# Patient Record
Sex: Male | Born: 1970 | Hispanic: Yes | Marital: Married | Smoking: Never smoker
Health system: Southern US, Community
[De-identification: ages and names within clinical notes are randomized; demographics above are authoritative.]

## PROBLEM LIST (undated history)

## (undated) DIAGNOSIS — N200 Calculus of kidney: Secondary | ICD-10-CM

## (undated) DIAGNOSIS — K8020 Calculus of gallbladder without cholecystitis without obstruction: Secondary | ICD-10-CM

## (undated) HISTORY — PX: CHOLECYSTECTOMY: SHX55

## (undated) HISTORY — PX: PERCUTANEOUS PINNING PHALANX FRACTURE OF HAND: SUR1027

## (undated) HISTORY — PX: KIDNEY STONE SURGERY: SHX686

---

## 2017-09-05 ENCOUNTER — Emergency Department
Admission: EM | Admit: 2017-09-05 | Discharge: 2017-09-05 | Disposition: A | Discharge status: Home / Self Care | Payer: Self-pay | Source: Home / Self Care | Attending: Family Medicine | Admitting: Family Medicine

## 2017-09-05 ENCOUNTER — Encounter: Payer: Self-pay | Admitting: *Deleted

## 2017-09-05 ENCOUNTER — Other Ambulatory Visit: Payer: Self-pay

## 2017-09-05 DIAGNOSIS — J209 Acute bronchitis, unspecified: Secondary | ICD-10-CM

## 2017-09-05 MED ORDER — BENZONATATE 200 MG PO CAPS: ORAL_CAPSULE | ORAL | 0 refills | Status: DC

## 2017-09-05 MED ORDER — AZITHROMYCIN 250 MG PO TABS: ORAL_TABLET | ORAL | 0 refills | Status: DC

## 2017-09-05 NOTE — ED Triage Notes (Signed)
Pt c/o productive cough and HA x 1 wk. Denies fever.

## 2017-09-05 NOTE — Discharge Instructions (Signed)
Take plain guaifenesin (1200mg extended release tabs such as Mucinex) twice daily, with plenty of water, for cough and congestion.  May add Pseudoephedrine (30mg, one or two every 4 to 6 hours) for sinus congestion.  Get adequate rest.   °May use Afrin nasal spray (or generic oxymetazoline) each morning for about 5 days and then discontinue.  Also recommend using saline nasal spray several times daily and saline nasal irrigation (AYR is a common brand).  °Try warm salt water gargles for sore throat.  °Stop all antihistamines for now, and other non-prescription cough/cold preparations. °  °  °

## 2017-09-05 NOTE — ED Provider Notes (Signed)
[name]    CSN: [id number] Arrival date & time: 09/05/17  1705     History   Chief Complaint Chief Complaint  Patient presents with  . Cough    HPI Willie Wise is a 47 y.o. male.   Patient complains of a persistent cough for one week with headache.  Mild sinus congestion but no sore throat.  No pleuritic pain or shortness of breath.   The history is provided by the patient.    History reviewed. No pertinent past medical history.  There are no active problems to display for this patient.   History reviewed. No pertinent surgical history.     Home Medications    Prior to Admission medications   Medication Sig Start Date End Date Taking? Authorizing Provider  azithromycin (ZITHROMAX Z-PAK) 250 MG tablet Take 2 tabs today; then begin one tab once daily for 4 more days. 09/05/17   Lattie Haw, MD  benzonatate (TESSALON) 200 MG capsule Take one cap by mouth at bedtime as needed for cough.  May repeat in 4 to 6 hours 09/05/17   Lattie Haw, MD    Family History History reviewed. No pertinent family history.  Social History Social History   Tobacco Use  . Smoking status: Never Smoker  . Smokeless tobacco: Never Used  Substance Use Topics  . Alcohol use: No    Frequency: Never  . Drug use: No     Allergies   Patient has no known allergies.   Review of Systems Review of Systems + sore throat + cough No pleuritic pain No wheezing + nasal congestion + post-nasal drainage No sinus pain/pressure No itchy/red eyes No earache No hemoptysis No SOB No fever, + chills No nausea No vomiting No abdominal pain No diarrhea No urinary symptoms No skin rash + fatigue No myalgias + headache Used OTC meds without relief   Physical Exam Triage Vital Signs ED Triage Vitals  Enc Vitals Group     BP 09/05/17 1737 122/81     Pulse Rate 09/05/17 1737 63     Resp 09/05/17 1737 16     Temp 09/05/17 1737 98 F (36.7 C)     Temp  Source 09/05/17 1737 Oral     SpO2 09/05/17 1737 97 %     Weight 09/05/17 1743 224 lb (101.6 kg)     Height 09/05/17 1743 6' (1.829 m)     Head Circumference --      Peak Flow --      Pain Score 09/05/17 1738 0     Pain Loc --      Pain Edu? --      Excl. in GC? --    No data found.  Updated Vital Signs BP 122/81 (BP Location: Right Arm)   Pulse 63   Temp 98 F (36.7 C) (Oral)   Resp 16   Ht 6' (1.829 m)   Wt 224 lb (101.6 kg)   SpO2 97%   BMI 30.38 kg/m   Visual Acuity Right Eye Distance:   Left Eye Distance:   Bilateral Distance:    Right Eye Near:   Left Eye Near:    Bilateral Near:     Physical Exam Nursing notes and Vital Signs reviewed. Appearance:  Patient appears stated age, and in no acute distress Eyes:  Pupils are equal, round, and reactive to light and accomodation.  Extraocular movement is intact.  Conjunctivae are not inflamed  Ears:  Canals normal.  Tympanic membranes normal.  Nose:  Mildly congested turbinates.  No sinus tenderness. Pharynx:  Normal Neck:  Supple.  Enlarged posterior/lateral nodes are palpated bilaterally, tender to palpation on the left.   Lungs:  Clear to auscultation.  Breath sounds are equal.  Moving air well. Heart:  Regular rate and rhythm without murmurs, rubs, or gallops.  Abdomen:  Nontender without masses or hepatosplenomegaly.  Bowel sounds are present.  No CVA or flank tenderness.  Extremities:  No edema.  Skin:  No rash present.    UC Treatments / Results  Labs (all labs ordered are listed, but only abnormal results are displayed) Labs Reviewed - No data to display  EKG  EKG Interpretation None       Radiology No results found.  Procedures Procedures (including critical care time)  Medications Ordered in UC Medications - No data to display   Initial Impression / Assessment and Plan / UC Course  I have reviewed the triage vital signs and the nursing notes.  Pertinent labs & imaging results that were  available during my care of the patient were reviewed by me and considered in my medical decision making (see chart for details).    Begin Z-pak for atypical coverage. Prescription written for Benzonatate [facility](Tessalon) to take at bedtime for night-time cough.  Take plain guaifenesin (1200mg  extended release tabs such as Mucinex) twice daily, with plenty of water, for cough and congestion.  May add Pseudoephedrine (30mg , one or two every 4 to 6 hours) for sinus congestion.  Get adequate rest.   May use Afrin nasal spray (or generic oxymetazoline) each morning for about 5 days and then discontinue.  Also recommend using saline nasal spray several times daily and saline nasal irrigation (AYR is a common brand).   Try warm salt water gargles for sore throat.  Stop all antihistamines for now, and other non-prescription cough/cold preparations. Followup with Family Doctor if not improved in one week.     Final Clinical Impressions(s) / UC Diagnoses   Final diagnoses:  Acute bronchitis, unspecified organism    ED Discharge Orders        Ordered    azithromycin (ZITHROMAX Z-PAK) 250 MG tablet     09/05/17 1817    benzonatate (TESSALON) 200 MG capsule     09/05/17 1817           Lattie HawBeese, Ameliya Nicotra A, MD 09/17/17 1520

## 2018-08-01 ENCOUNTER — Emergency Department
Admission: EM | Admit: 2018-08-01 | Discharge: 2018-08-01 | Disposition: A | Discharge status: Home / Self Care | Payer: Self-pay | Source: Home / Self Care | Attending: Family Medicine | Admitting: Family Medicine

## 2018-08-01 ENCOUNTER — Other Ambulatory Visit: Payer: Self-pay

## 2018-08-01 ENCOUNTER — Emergency Department (INDEPENDENT_AMBULATORY_CARE_PROVIDER_SITE_OTHER): Payer: Self-pay

## 2018-08-01 ENCOUNTER — Encounter: Payer: Self-pay | Admitting: *Deleted

## 2018-08-01 DIAGNOSIS — R109 Unspecified abdominal pain: Secondary | ICD-10-CM

## 2018-08-01 HISTORY — DX: Calculus of kidney: N20.0

## 2018-08-01 LAB — POCT URINALYSIS DIP (MANUAL ENTRY)
Bilirubin, UA: NEGATIVE
Glucose, UA: NEGATIVE mg/dL
Ketones, POC UA: NEGATIVE mg/dL
Leukocytes, UA: NEGATIVE
Nitrite, UA: NEGATIVE
Protein Ur, POC: NEGATIVE mg/dL
Spec Grav, UA: 1.02 (ref 1.010–1.025)
Urobilinogen, UA: 0.2 E.U./dL
pH, UA: 6.5 (ref 5.0–8.0)

## 2018-08-01 MED ORDER — NAPROXEN 500 MG PO TABS: 500.0000 mg | ORAL_TABLET | Freq: Two times a day (BID) | ORAL | 0 refills | Status: DC

## 2018-08-01 MED ORDER — TAMSULOSIN HCL 0.4 MG PO CAPS: 0.4000 mg | ORAL_CAPSULE | Freq: Every day | ORAL | 0 refills | Status: DC

## 2018-08-01 NOTE — ED Triage Notes (Signed)
Pt c/o RT flank pain intermittently x 2 months. Hx of kidney stones.

## 2018-08-01 NOTE — Discharge Instructions (Signed)
°  Your symptoms are likely due to kidney stones. Be sure to stay well hydrated. You may take the prescribed medication to help with pain. Please follow up with family medicine or a urologist next week if not improving.  Call 911 or go to the hospital if symptoms worsening.

## 2018-08-01 NOTE — ED Provider Notes (Signed)
[facility]    CSN: [id number] Arrival date & time: 08/01/18  1810     History   Chief Complaint Chief Complaint  Patient presents with  . Flank Pain    HPI Willie Wise is a 48 y.o. male.   HPI Willie Wise is a 48 y.o. male presenting to UC with c/o intermittent Right flank pain for about 2 months. Hx of kidney stones. Denies fever, chills, n/v/d. Denies dysuria or hematuria. He has not tried any medication for the pain. Pt does work for [employer] but denies known injury.    Past Medical History:  Diagnosis Date  . Kidney stone     There are no active problems to display for this patient.   Past Surgical History:  Procedure Laterality Date  . KIDNEY STONE SURGERY         Home Medications    Prior to Admission medications   Medication Sig Start Date End Date Taking? Authorizing Provider  naproxen (NAPROSYN) 500 MG tablet Take 1 tablet (500 mg total) by mouth 2 (two) times daily. 08/01/18   Lurene Shadow, PA-C  tamsulosin (FLOMAX) 0.4 MG CAPS capsule Take 1 capsule (0.4 mg total) by mouth daily after breakfast. 08/01/18   Lurene Shadow, PA-C    Family History History reviewed. No pertinent family history.  Social History Social History   Tobacco Use  . Smoking status: Never Smoker  . Smokeless tobacco: Never Used  Substance Use Topics  . Alcohol use: Yes    Frequency: Never    Comment: occ  . Drug use: No     Allergies   Patient has no known allergies.   Review of Systems Review of Systems  Constitutional: Negative for chills and fever.  Gastrointestinal: Positive for abdominal pain (Right side). Negative for diarrhea, nausea and vomiting.  Genitourinary: Positive for flank pain (Right). Negative for dysuria, frequency, hematuria and penile pain.  Musculoskeletal: Positive for back pain. Negative for myalgias.     Physical Exam Triage Vital Signs ED Triage Vitals  Enc Vitals Group     BP 08/01/18 1925 130/81     Pulse  Rate 08/01/18 1925 62     Resp 08/01/18 1925 18     Temp 08/01/18 1925 98 F (36.7 C)     Temp Source 08/01/18 1925 Oral     SpO2 08/01/18 1925 98 %     Weight 08/01/18 1927 228 lb (103.4 kg)     Height 08/01/18 1927 6' (1.829 m)     Head Circumference --      Peak Flow --      Pain Score 08/01/18 1926 1     Pain Loc --      Pain Edu? --      Excl. in GC? --    No data found.  Updated Vital Signs BP 130/81 (BP Location: Right Arm)   Pulse 62   Temp 98 F (36.7 C) (Oral)   Resp 18   Ht 6' (1.829 m)   Wt 228 lb (103.4 kg)   SpO2 98%   BMI 30.92 kg/m   Visual Acuity Right Eye Distance:   Left Eye Distance:   Bilateral Distance:    Right Eye Near:   Left Eye Near:    Bilateral Near:     Physical Exam Vitals signs and nursing note reviewed.  Constitutional:      Appearance: Normal appearance. He is well-developed.  HENT:     Head: Normocephalic and atraumatic.  Neck:     Musculoskeletal: Normal range of motion.  Cardiovascular:     Rate and Rhythm: Normal rate and regular rhythm.  Pulmonary:     Effort: Pulmonary effort is normal.     Breath sounds: Normal breath sounds.  Abdominal:     General: There is no distension.     Palpations: Abdomen is soft.     Tenderness: There is no abdominal tenderness. There is no right CVA tenderness or left CVA tenderness.  Musculoskeletal: Normal range of motion.  Skin:    General: Skin is warm and dry.  Neurological:     Mental Status: He is alert and oriented to person, place, and time.  Psychiatric:        Behavior: Behavior normal.      UC Treatments / Results  Labs (all labs ordered are listed, but only abnormal results are displayed) Labs Reviewed  POCT URINALYSIS DIP (MANUAL ENTRY) - Abnormal; Notable for the following components:      Result Value   Blood, UA trace-lysed (*)    All other components within normal limits    EKG None  Radiology No results found.  Procedures Procedures (including  critical care time)  Medications Ordered in UC Medications - No data to display  Initial Impression / Assessment and Plan / UC Course  I have reviewed the triage vital signs and the nursing notes.  Pertinent labs & imaging results that were available during my care of the patient were reviewed by me and considered in my medical decision making (see chart for details).     Normal exam Trace hematuria Possible small renal stone Encouraged symptomatic tx F/u with PCP or urology as needed.  Final Clinical Impressions(s) / UC Diagnoses   Final diagnoses:  Right flank pain     Discharge Instructions      Your symptoms are likely due to kidney stones. Be sure to stay well hydrated. You may take the prescribed medication to help with pain. Please follow up with family medicine or a urologist next week if not improving.  Call 911 or go to the hospital if symptoms worsening.     ED Prescriptions    Medication Sig Dispense Auth. Provider   tamsulosin (FLOMAX) 0.4 MG CAPS capsule Take 1 capsule (0.4 mg total) by mouth daily after breakfast. 20 capsule Doroteo Glassman, Arsal Tappan O, PA-C   naproxen (NAPROSYN) 500 MG tablet Take 1 tablet (500 mg total) by mouth 2 (two) times daily. 30 tablet Lurene Shadow, PA-C     Controlled Substance Prescriptions [place] Controlled Substance Registry consulted? Not Applicable   Rolla Plate 08/04/18 9449

## 2019-06-27 ENCOUNTER — Ambulatory Visit: Payer: HRSA Program | Attending: Psychology

## 2019-06-27 ENCOUNTER — Other Ambulatory Visit: Payer: Self-pay

## 2019-06-27 DIAGNOSIS — Z20828 Contact with and (suspected) exposure to other viral communicable diseases: Secondary | ICD-10-CM | POA: Insufficient documentation

## 2019-06-27 DIAGNOSIS — Z20822 Contact with and (suspected) exposure to covid-19: Secondary | ICD-10-CM

## 2019-06-28 LAB — NOVEL CORONAVIRUS, NAA: SARS-CoV-2, NAA: NOT DETECTED

## 2019-06-28 NOTE — Progress Notes (Signed)
Orders moved to this encounter.  

## 2019-06-28 NOTE — Progress Notes (Signed)
Order(s) created erroneously. Erroneous order ID: [id number]  Order moved by: Brigitte Pulse  Order move date/time: 06/28/2019 2:47 PM  Source Patient: [name]  Source Contact: 06/27/2019  Destination Patient: [name]  Destination Contact: 06/27/2019

## 2019-06-28 NOTE — Progress Notes (Signed)
Order(s) created erroneously. Erroneous order ID: [id number]  Order moved by: Caisen Mangas M  Order move date/time: 06/28/2019 2:47 PM  Source Patient: [id number]  Source Contact: 06/27/2019  Destination Patient: [id number]  Destination Contact: 06/27/2019 

## 2019-07-02 ENCOUNTER — Other Ambulatory Visit: Payer: Self-pay

## 2019-08-05 ENCOUNTER — Emergency Department (INDEPENDENT_AMBULATORY_CARE_PROVIDER_SITE_OTHER): Payer: Self-pay

## 2019-08-05 ENCOUNTER — Encounter: Payer: Self-pay | Admitting: Emergency Medicine

## 2019-08-05 ENCOUNTER — Emergency Department
Admission: EM | Admit: 2019-08-05 | Discharge: 2019-08-05 | Disposition: A | Discharge status: Home / Self Care | Payer: Self-pay | Source: Home / Self Care | Attending: Family Medicine | Admitting: Family Medicine

## 2019-08-05 ENCOUNTER — Other Ambulatory Visit: Payer: Self-pay

## 2019-08-05 DIAGNOSIS — R1031 Right lower quadrant pain: Secondary | ICD-10-CM

## 2019-08-05 DIAGNOSIS — R0781 Pleurodynia: Secondary | ICD-10-CM

## 2019-08-05 DIAGNOSIS — R52 Pain, unspecified: Secondary | ICD-10-CM

## 2019-08-05 DIAGNOSIS — G8929 Other chronic pain: Secondary | ICD-10-CM

## 2019-08-05 LAB — POCT URINALYSIS DIP (MANUAL ENTRY)
Bilirubin, UA: NEGATIVE
Glucose, UA: NEGATIVE mg/dL
Ketones, POC UA: NEGATIVE mg/dL
Leukocytes, UA: NEGATIVE
Nitrite, UA: NEGATIVE
Protein Ur, POC: NEGATIVE mg/dL
Spec Grav, UA: 1.025 (ref 1.010–1.025)
Urobilinogen, UA: 0.2 E.U./dL
pH, UA: 5.5 (ref 5.0–8.0)

## 2019-08-05 NOTE — ED Provider Notes (Signed)
[name]    CSN: [id number] Arrival date & time: 08/05/19  1155      History   Chief Complaint Chief Complaint  Patient presents with  . Back Pain    HPI Mable Lashley is a 49 y.o. male.   Patient complains of two to three month history of intermittent right back pain that sometimes radiates to his lower right abdomen.  He denies urinary symptoms and changes in bowel movements.  He feels well otherwise without fevers, chills, and sweats.  He recalls no injury Past surgical history of cholecystectomy 10 years ago.  He states that he had a kidney stone about 10 years ago.  The history is provided by the patient.    Past Medical History:  Diagnosis Date  . Kidney stone     There are no problems to display for this patient.   Past Surgical History:  Procedure Laterality Date  . KIDNEY STONE SURGERY         Home Medications    Prior to Admission medications   Medication Sig Start Date End Date Taking? Authorizing Provider  naproxen (NAPROSYN) 500 MG tablet Take 1 tablet (500 mg total) by mouth 2 (two) times daily. 08/01/18   Lurene Shadow, PA-C    Family History Family History  Problem Relation Age of Onset  . Healthy Mother     Social History Social History   Tobacco Use  . Smoking status: Never Smoker  . Smokeless tobacco: Never Used  Substance Use Topics  . Alcohol use: Yes    Comment: occ  . Drug use: No     Allergies   Patient has no known allergies.   Review of Systems Review of Systems  Constitutional: Negative for activity change, appetite change, chills, diaphoresis, fatigue, fever and unexpected weight change.  HENT: Negative.   Eyes: Negative.   Respiratory: Negative.   Cardiovascular: Negative.   Gastrointestinal: Positive for abdominal pain. Negative for abdominal distention, anal bleeding, blood in stool, constipation, diarrhea, nausea, rectal pain and vomiting.  Genitourinary: Negative.   Musculoskeletal:  Positive for back pain.  Skin: Negative.   All other systems reviewed and are negative.    Physical Exam Triage Vital Signs ED Triage Vitals  Enc Vitals Group     BP 08/05/19 1218 (!) 146/86     Pulse Rate 08/05/19 1218 78     Resp --      Temp 08/05/19 1218 98.4 F (36.9 C)     Temp Source 08/05/19 1218 Oral     SpO2 08/05/19 1218 97 %     Weight 08/05/19 1219 220 lb (99.8 kg)     Height 08/05/19 1219 6' (1.829 m)     Head Circumference --      Peak Flow --      Pain Score 08/05/19 1218 2     Pain Loc --      Pain Edu? --      Excl. in GC? --    No data found.  Updated Vital Signs BP (!) 146/86 (BP Location: Right Arm)   Pulse 78   Temp 98.4 F (36.9 C) (Oral)   Ht 6' (1.829 m)   Wt 99.8 kg   SpO2 97%   BMI 29.84 kg/m   Visual Acuity Right Eye Distance:   Left Eye Distance:   Bilateral Distance:    Right Eye Near:   Left Eye Near:    Bilateral Near:     Physical Exam Vitals  and nursing note reviewed.  Constitutional:      General: He is not in acute distress.    Appearance: He is not ill-appearing.  HENT:     Head: Normocephalic.     Right Ear: External ear normal.     Left Ear: External ear normal.     Nose: Nose normal.     Mouth/Throat:     Pharynx: Oropharynx is clear.  Eyes:     Pupils: Pupils are equal, round, and reactive to light.  Cardiovascular:     Heart sounds: Normal heart sounds.  Pulmonary:     Breath sounds: Normal breath sounds.       Comments: Tenderness right inferior/posterior/lateral ribs. Genitourinary:    Penis: Normal. No tenderness.      Testes: Normal.       Comments: There is distinct tenderness to palpation at the right internal ring with valsalva.  No definite hernia palpated. Musculoskeletal:     Right lower leg: No edema.     Left lower leg: No edema.       Legs:     Comments: Patient has tenderness to palpation right inguinal area with resisted flexion of right hip.  Lymphadenopathy:     Cervical: No  cervical adenopathy.  Skin:    General: Skin is warm and dry.     Findings: No rash.  Neurological:     Mental Status: He is alert.      UC Treatments / Results  Labs (all labs ordered are listed, but only abnormal results are displayed) Labs Reviewed  POCT URINALYSIS DIP (MANUAL ENTRY) - Abnormal; Notable for the following components:      Result Value   Blood, UA trace-intact (*)    All other components within normal limits    EKG   Radiology No results found.  Procedures Procedures (including critical care time)  Medications Ordered in UC Medications - No data to display  Initial Impression / Assessment and Plan / UC Course  I have reviewed the triage vital signs and the nursing notes.  Pertinent labs & imaging results that were available during my care of the patient were reviewed by me and considered in my medical decision making (see chart for details).    Suspect right hip flexor strain.  ?hernia.   If not improved about 2 weeks, recommend follow-up with surgeon (? Indirect hernia).   Final Clinical Impressions(s) / UC Diagnoses   Final diagnoses:  Rib pain on right side  Right groin pain   Discharge Instructions   None    ED Prescriptions    None        Kandra Nicolas, MD 08/07/19 1554

## 2019-08-05 NOTE — ED Triage Notes (Signed)
Low RT back pain, radiates to low RT Abdomin, intermittent x 3-4  months, today pain is in back. Had a scan 3-4 ago, negative for kidney stones, pain continues.

## 2019-08-05 NOTE — Discharge Instructions (Addendum)
Apply ice pack for 20 to 30 minutes, 3 to 4 times daily  Continue until pain and swelling decrease.  May take Ibuprofen 200mg, 4 tabs every 8 hours with food.  

## 2019-10-18 ENCOUNTER — Emergency Department (INDEPENDENT_AMBULATORY_CARE_PROVIDER_SITE_OTHER): Payer: Self-pay

## 2019-10-18 ENCOUNTER — Other Ambulatory Visit: Payer: Self-pay

## 2019-10-18 ENCOUNTER — Encounter: Payer: Self-pay | Admitting: Emergency Medicine

## 2019-10-18 ENCOUNTER — Emergency Department
Admission: EM | Admit: 2019-10-18 | Discharge: 2019-10-18 | Disposition: A | Discharge status: Home / Self Care | Payer: Self-pay | Source: Home / Self Care | Attending: Family Medicine | Admitting: Family Medicine

## 2019-10-18 DIAGNOSIS — N200 Calculus of kidney: Secondary | ICD-10-CM

## 2019-10-18 DIAGNOSIS — R300 Dysuria: Secondary | ICD-10-CM

## 2019-10-18 HISTORY — DX: Calculus of gallbladder without cholecystitis without obstruction: K80.20

## 2019-10-18 LAB — POCT URINALYSIS DIP (MANUAL ENTRY)
Bilirubin, UA: NEGATIVE
Glucose, UA: NEGATIVE mg/dL
Ketones, POC UA: NEGATIVE mg/dL
Leukocytes, UA: NEGATIVE
Nitrite, UA: NEGATIVE
Protein Ur, POC: 30 mg/dL — AB
Spec Grav, UA: >=1.03 — AB (ref 1.010–1.025)
Urobilinogen, UA: 0.2 E.U./dL — AB
pH, UA: 5.5 (ref 5.0–8.0)

## 2019-10-18 MED ORDER — TAMSULOSIN HCL 0.4 MG PO CAPS: 0.4000 mg | ORAL_CAPSULE | Freq: Every day | ORAL | 1 refills | Status: DC

## 2019-10-18 MED ORDER — IBUPROFEN 800 MG PO TABS: 800.0000 mg | ORAL_TABLET | Freq: Three times a day (TID) | ORAL | 0 refills | Status: AC

## 2019-10-18 NOTE — Discharge Instructions (Signed)
Increase fluid intake. Strain all urine. If symptoms become significantly worse during the night or over the weekend, proceed to the local emergency room.

## 2019-10-18 NOTE — ED Provider Notes (Signed)
[facility]    CSN: [id number] Arrival date & time: 10/18/19  1437      History   Chief Complaint Chief Complaint  Patient presents with  . Dysuria  . Flank Pain    right lower    HPI Willie Wise is a 49 y.o. male.   Four days ago patient developed right lower back pain and noticed blood in his urine. He has also had urgency/frequency.  He denies fevers, chills, and sweats, abdominal pain, and nausea/vomiting.  He has had kidney stone in the past.  The history is provided by the patient.  Flank Pain This is a new problem. The current episode started more than 2 days ago. Episode frequency: intermittently. The problem has not changed since onset.Pertinent negatives include no abdominal pain. Nothing aggravates the symptoms. Nothing relieves the symptoms. He has tried nothing for the symptoms.    Past Medical History:  Diagnosis Date  . Cholelithiases   . Kidney stone     There are no problems to display for this patient.   Past Surgical History:  Procedure Laterality Date  . CHOLECYSTECTOMY     pt is not sure if his gall bladder was removed- thinks so  . KIDNEY STONE SURGERY    . PERCUTANEOUS PINNING PHALANX FRACTURE OF HAND Right        Home Medications    Prior to Admission medications   Medication Sig Start Date End Date Taking? Authorizing Provider  ibuprofen (ADVIL) 800 MG tablet Take 1 tablet (800 mg total) by mouth 3 (three) times daily. as needed 10/18/19   Kandra Nicolas, MD  tamsulosin (FLOMAX) 0.4 MG CAPS capsule Take 1 capsule (0.4 mg total) by mouth daily after supper. 10/18/19   Kandra Nicolas, MD    Family History Family History  Problem Relation Age of Onset  . Healthy Mother     Social History Social History   Tobacco Use  . Smoking status: Never Smoker  . Smokeless tobacco: Never Used  Substance Use Topics  . Alcohol use: Yes    Comment: occ  . Drug use: No     Allergies   Patient has no known  allergies.   Review of Systems Review of Systems  Constitutional: Negative.   HENT: Negative.   Gastrointestinal: Negative for abdominal pain, nausea and vomiting.  Genitourinary: Positive for flank pain, frequency, hematuria and urgency. Negative for difficulty urinating, penile pain, penile swelling, scrotal swelling and testicular pain.  Skin: Negative.   Neurological: Negative.      Physical Exam Triage Vital Signs ED Triage Vitals  Enc Vitals Group     BP 10/18/19 1455 132/83     Pulse Rate 10/18/19 1455 67     Resp 10/18/19 1455 15     Temp 10/18/19 1455 98.6 F (37 C)     Temp Source 10/18/19 1455 Oral     SpO2 10/18/19 1455 96 %     Weight 10/18/19 1455 214 lb (97.1 kg)     Height 10/18/19 1455 6' (1.829 m)     Head Circumference --      Peak Flow --      Pain Score 10/18/19 1603 3     Pain Loc --      Pain Edu? --      Excl. in [place]? --    No data found.  Updated Vital Signs BP 132/83 (BP Location: Right Arm)   Pulse 67   Temp 98.6 F (37 C) (  Oral)   Resp 15   Ht 6' (1.829 m)   Wt 97.1 kg   SpO2 96%   BMI 29.02 kg/m   Visual Acuity Right Eye Distance:   Left Eye Distance:   Bilateral Distance:    Right Eye Near:   Left Eye Near:    Bilateral Near:     Physical Exam Nursing notes and Vital Signs reviewed. Appearance:  Patient appears stated age, and in no acute distress.    Eyes:  Pupils are equal, round, and reactive to light and accomodation.  Extraocular movement is intact.  Conjunctivae are not inflamed   Pharynx:  Normal; moist mucous membranes  Neck:  Supple.  No adenopathy Lungs:  Clear to auscultation.  Breath sounds are equal.  Moving air well. Heart:  Regular rate and rhythm without murmurs, rubs, or gallops.  Abdomen:  Nontender without masses or hepatosplenomegaly.  Bowel sounds are present.  Mild right flank tenderness is present.  Extremities:  No edema.  Skin:  No rash present.     UC Treatments / Results  Labs (all labs  ordered are listed, but only abnormal results are displayed) Labs Reviewed  POCT URINALYSIS DIP (MANUAL ENTRY) - Abnormal; Notable for the following components:      Result Value   Color, UA other (*)    Clarity, UA cloudy (*)    Spec Grav, UA >=1.030 (*)    Blood, UA large (*)    Protein Ur, POC =30 (*)    Urobilinogen, UA 0.2 (*)    All other components within normal limits    EKG   Radiology CT Renal Stone Study  Result Date: 10/18/2019 CLINICAL DATA:  Right flank pain, hematuria EXAM: CT ABDOMEN AND PELVIS WITHOUT CONTRAST TECHNIQUE: Multidetector CT imaging of the abdomen and pelvis was performed following the standard protocol without IV contrast. COMPARISON:  10/04/2018 FINDINGS: Lower chest: Lung bases are clear. Hepatobiliary: Unenhanced liver is unremarkable. Status post cholecystectomy. No intrahepatic or extrahepatic ductal dilatation. Pancreas: Within normal limits. Spleen: Within normal limits. Adrenals/Urinary Tract: Adrenal glands are within normal limits. Punctate nonobstructing interpolar right renal calculus (coronal image 66). 5 mm proximal left ureteral calculus at the L5 level (coronal image 52), previously located within the right upper pole. Left kidney is within normal limits. No hydronephrosis. Bladder is underdistended but unremarkable. Stomach/Bowel: Stomach is within normal limits. No evidence of bowel obstruction. Normal appendix (series 2/image 87). Vascular/Lymphatic: No evidence of abdominal aortic aneurysm. No suspicious abdominopelvic lymphadenopathy. 11 mm short axis right deep inguinal node (series 2/image 76), previously 12 mm, likely reactive. Reproductive: Prostate is unremarkable. Other: No abdominopelvic ascites. Mild fat within the bilateral inguinal canals. Musculoskeletal: Mild degenerative changes at L5-S1. IMPRESSION: 5 mm proximal left ureteral calculus at the L5 level. No hydronephrosis. Electronically Signed   By: Charline Bills M.D.   On:  10/18/2019 15:56    Procedures Procedures (including critical care time)  Medications Ordered in UC Medications - No data to display  Initial Impression / Assessment and Plan / UC Course  I have reviewed the triage vital signs and the nursing notes.  Pertinent labs & imaging results that were available during my care of the patient were reviewed by me and considered in my medical decision making (see chart for details).    Begin Flomax, and Ibuprofen. Followup with urologist if not improved about 4 days.   Final Clinical Impressions(s) / UC Diagnoses   Final diagnoses:  Dysuria  Kidney stone on right  side     Discharge Instructions     Increase fluid intake. Strain all urine. If symptoms become significantly worse during the night or over the weekend, proceed to the local emergency room.     ED Prescriptions    Medication Sig Dispense Auth. Provider   tamsulosin (FLOMAX) 0.4 MG CAPS capsule Take 1 capsule (0.4 mg total) by mouth daily after supper. 12 capsule Lattie Haw, MD   ibuprofen (ADVIL) 800 MG tablet Take 1 tablet (800 mg total) by mouth 3 (three) times daily. as needed 21 tablet Lattie Haw, MD        Lattie Haw, MD 10/22/19 1146

## 2019-10-18 NOTE — ED Notes (Signed)
Pt in room waiting on ct results o other changes at this time

## 2019-10-18 NOTE — ED Triage Notes (Signed)
C/o lower back pain/flank pain w/ blood in urine since Monday C/o urgency/frequency Dx w/ couple of kidney stones - none seen on scan per pt

## 2019-10-20 LAB — URINE CULTURE
MICRO NUMBER:: 10346432
Result:: NO GROWTH
SPECIMEN QUALITY:: ADEQUATE

## 2019-11-29 ENCOUNTER — Encounter (HOSPITAL_COMMUNITY): Payer: Self-pay | Admitting: Emergency Medicine

## 2019-11-29 ENCOUNTER — Emergency Department (HOSPITAL_COMMUNITY)
Admission: EM | Admit: 2019-11-29 | Discharge: 2019-11-30 | Disposition: A | Discharge status: Home / Self Care | Payer: Self-pay | Attending: Emergency Medicine | Admitting: Emergency Medicine

## 2019-11-29 ENCOUNTER — Other Ambulatory Visit: Payer: Self-pay

## 2019-11-29 DIAGNOSIS — Z79899 Other long term (current) drug therapy: Secondary | ICD-10-CM | POA: Insufficient documentation

## 2019-11-29 DIAGNOSIS — N200 Calculus of kidney: Secondary | ICD-10-CM | POA: Insufficient documentation

## 2019-11-29 LAB — CBC
HCT: 43.2 % (ref 39.0–52.0)
Hemoglobin: 14.2 g/dL (ref 13.0–17.0)
MCH: 30.8 pg (ref 26.0–34.0)
MCHC: 32.9 g/dL (ref 30.0–36.0)
MCV: 93.7 fL (ref 80.0–100.0)
Platelets: 209 10*3/uL (ref 150–400)
RBC: 4.61 MIL/uL (ref 4.22–5.81)
RDW: 13.2 % (ref 11.5–15.5)
WBC: 6.5 10*3/uL (ref 4.0–10.5)
nRBC: 0 % (ref 0.0–0.2)

## 2019-11-29 LAB — COMPREHENSIVE METABOLIC PANEL
ALT: 35 U/L (ref 0–44)
AST: 44 U/L — ABNORMAL HIGH (ref 15–41)
Albumin: 4.1 g/dL (ref 3.5–5.0)
Alkaline Phosphatase: 102 U/L (ref 38–126)
Anion gap: 10 (ref 5–15)
BUN: 21 mg/dL — ABNORMAL HIGH (ref 6–20)
CO2: 25 mmol/L (ref 22–32)
Calcium: 9.1 mg/dL (ref 8.9–10.3)
Chloride: 105 mmol/L (ref 98–111)
Creatinine, Ser: 1.32 mg/dL — ABNORMAL HIGH (ref 0.61–1.24)
GFR calc Af Amer: >60 mL/min (ref >60)
GFR calc non Af Amer: >60 mL/min (ref >60)
Glucose, Bld: 89 mg/dL (ref 70–99)
Potassium: 3.8 mmol/L (ref 3.5–5.1)
Sodium: 140 mmol/L (ref 135–145)
Total Bilirubin: 1.1 mg/dL (ref 0.3–1.2)
Total Protein: 7.5 g/dL (ref 6.5–8.1)

## 2019-11-29 LAB — URINALYSIS, ROUTINE W REFLEX MICROSCOPIC
Bacteria, UA: NONE SEEN
Bilirubin Urine: NEGATIVE
Glucose, UA: NEGATIVE mg/dL
Ketones, ur: NEGATIVE mg/dL
Leukocytes,Ua: NEGATIVE
Nitrite: NEGATIVE
Protein, ur: NEGATIVE mg/dL
Specific Gravity, Urine: 1.014 (ref 1.005–1.030)
pH: 5 (ref 5.0–8.0)

## 2019-11-29 LAB — LIPASE, BLOOD: Lipase: 28 U/L (ref 11–51)

## 2019-11-29 MED ORDER — SODIUM CHLORIDE 0.9% FLUSH
3.0000 mL | Freq: Once | INTRAVENOUS | Status: AC
  Administered 2019-11-30: 3 mL via INTRAVENOUS

## 2019-11-29 NOTE — ED Triage Notes (Signed)
Patient reports hypogastric abdominal pain and low back pain for 1 week , no emesis or diarrhea , denies fever or chills , patient added hemorrhoid pain with no bleeding for several days .

## 2019-11-30 ENCOUNTER — Emergency Department (HOSPITAL_COMMUNITY): Payer: Self-pay

## 2019-11-30 MED ORDER — DOCUSATE SODIUM 100 MG PO CAPS: ORAL_CAPSULE | ORAL | 0 refills | Status: AC

## 2019-11-30 MED ORDER — HYDROCODONE-ACETAMINOPHEN 5-325 MG PO TABS: 1.0000 | ORAL_TABLET | Freq: Four times a day (QID) | ORAL | 0 refills | Status: DC | PRN

## 2019-11-30 MED ORDER — IOHEXOL 300 MG/ML  SOLN
100.0000 mL | Freq: Once | INTRAMUSCULAR | Status: AC | PRN
  Administered 2019-11-30: 100 mL via INTRAVENOUS

## 2019-11-30 MED ORDER — ONDANSETRON 4 MG PO TBDP: 4.0000 mg | ORAL_TABLET | Freq: Three times a day (TID) | ORAL | 0 refills | Status: DC | PRN

## 2019-11-30 MED ORDER — HYDROCORTISONE 1 % EX CREA: 1.0000 "application " | TOPICAL_CREAM | Freq: Two times a day (BID) | CUTANEOUS | 2 refills | Status: DC

## 2019-11-30 NOTE — ED Provider Notes (Signed)
[facility] EMERGENCY DEPARTMENT Provider Note   CSN: [id number] Arrival date & time: 11/29/19  2109     History Chief Complaint  Patient presents with  . Abdominal Pain  . Hemorrhoids    Willie Wise is a 49 y.o. male.  Patient presents to the emergency department with a chief complaint of abdominal pain.  He reports having lower abdominal pain for the past couple of days.  He states that he has been unable to have bowel movements, and also reports having decreased urination.  He is concerned for hernia causing a blockage.  He states that he felt a mass in his abdomen yesterday or the day before.  Additionally, he reports having a hemorrhoid, and has had hemorrhoids in the past.  He denies any fevers or chills.  Denies any successful treatments prior to arrival.  The history is provided by the patient. No language interpreter was used.       Past Medical History:  Diagnosis Date  . Cholelithiases   . Kidney stone     There are no problems to display for this patient.   Past Surgical History:  Procedure Laterality Date  . CHOLECYSTECTOMY     pt is not sure if his gall bladder was removed- thinks so  . KIDNEY STONE SURGERY    . PERCUTANEOUS PINNING PHALANX FRACTURE OF HAND Right        Family History  Problem Relation Age of Onset  . Healthy Mother     Social History   Tobacco Use  . Smoking status: Never Smoker  . Smokeless tobacco: Never Used  Substance Use Topics  . Alcohol use: Yes    Comment: occ  . Drug use: No    Home Medications Prior to Admission medications   Medication Sig Start Date End Date Taking? Authorizing Provider  ibuprofen (ADVIL) 800 MG tablet Take 1 tablet (800 mg total) by mouth 3 (three) times daily. as needed 10/18/19   Lattie Haw, MD  tamsulosin (FLOMAX) 0.4 MG CAPS capsule Take 1 capsule (0.4 mg total) by mouth daily after supper. 10/18/19   Lattie Haw, MD    Allergies    Patient has no known  allergies.  Review of Systems   Review of Systems  All other systems reviewed and are negative.   Physical Exam Updated Vital Signs BP 136/88   Pulse (!) 59   Temp 98.4 F (36.9 C) (Oral)   Resp 16   Ht 6' (1.829 m)   Wt 100 kg   SpO2 99%   BMI 29.90 kg/m   Physical Exam Vitals and nursing note reviewed.  Constitutional:      Appearance: He is well-developed.  HENT:     Head: Normocephalic and atraumatic.  Eyes:     Conjunctiva/sclera: Conjunctivae normal.  Cardiovascular:     Rate and Rhythm: Normal rate and regular rhythm.     Heart sounds: No murmur.  Pulmonary:     Effort: Pulmonary effort is normal. No respiratory distress.     Breath sounds: Normal breath sounds.  Abdominal:     Palpations: Abdomen is soft.     Tenderness: There is no abdominal tenderness.     Comments: Mild lower abdominal discomfort, but without focal tenderness, no masses are palpated  Genitourinary:    Comments: 1 cm in diameter nonthrombosed external hemorrhoid Musculoskeletal:     Cervical back: Neck supple.  Skin:    General: Skin is warm and dry.  Neurological:  Mental Status: He is alert and oriented to person, place, and time.  Psychiatric:        Mood and Affect: Mood normal.        Behavior: Behavior normal.     ED Results / Procedures / Treatments   Labs (all labs ordered are listed, but only abnormal results are displayed) Labs Reviewed  COMPREHENSIVE METABOLIC PANEL - Abnormal; Notable for the following components:      Result Value   BUN 21 (*)    Creatinine, Ser 1.32 (*)    AST 44 (*)    All other components within normal limits  URINALYSIS, ROUTINE W REFLEX MICROSCOPIC - Abnormal; Notable for the following components:   Hgb urine dipstick MODERATE (*)    All other components within normal limits  LIPASE, BLOOD  CBC    EKG None  Radiology No results found.  Procedures Procedures (including critical care time)  Medications Ordered in  ED Medications  sodium chloride flush (NS) 0.9 % injection 3 mL (0 mLs Intravenous Hold 11/30/19 0124)    ED Course  I have reviewed the triage vital signs and the nursing notes.  Pertinent labs & imaging results that were available during my care of the patient were reviewed by me and considered in my medical decision making (see chart for details).    MDM Rules/Calculators/A&P                     This patient complains of lower abdominal pain, this involves an extensive number of treatment options, and is a complaint that carries with it a high risk of complications and morbidity.  The differential diagnosis includes acute abdomen including appendicitis, small bowel obstruction diverticulitis.  Differential also includes kidney stone and hernia.  Pertinent Labs I ordered, reviewed, and interpreted labs, which included CBC unremarkable, creatinine mildly increased at 1.32, there is some hemoglobin and RBCs in the urine, no WBCs.  Imaging Interpretation I ordered imaging studies which included CT abdomen.  I independently visualized and interpreted the CT abdomen, which showed right-sided ureterolithiasis.   Medications I ordered medication Norco for pain   Reassessments After the interventions stated above, I reevaluated the patient and found improved and stable for discharge.  Plan for follow-up with urology.  Return precautions discussed.  Final Clinical Impression(s) / ED Diagnoses Final diagnoses:  Kidney stone    Rx / DC Orders ED Discharge Orders         Ordered    HYDROcodone-acetaminophen (NORCO/VICODIN) 5-325 MG tablet  Every 6 hours PRN     11/30/19 0415    ondansetron (ZOFRAN ODT) 4 MG disintegrating tablet  Every 8 hours PRN     11/30/19 0415           Montine Circle, PA-C 11/30/19 0418    Palumbo, April, MD 11/30/19 [id number]

## 2019-11-30 NOTE — ED Notes (Signed)
Patient verbalized understanding of dc instructions, vss, ambulatory with nad.   

## 2020-12-14 IMAGING — CT CT RENAL STONE PROTOCOL
2 of 4 series · 16 of 46 positions shown, 18 images · non-contrast
Comparison: 10/04/2018

CLINICAL DATA: Right flank pain, hematuria

EXAM:
CT ABDOMEN AND PELVIS WITHOUT CONTRAST
TECHNIQUE: Multidetector CT imaging of the abdomen and pelvis was performed
following the standard protocol without IV contrast.

[Series 2: axial st · axial · 0.98mm/px · z∈[-570,-70]mm · 13 of 110 slices shown, 15 images]
[im 5/110  soft-tissue]
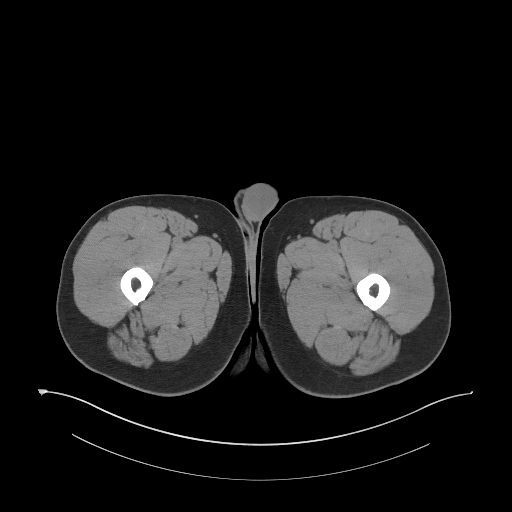
[im 5/110  bone]
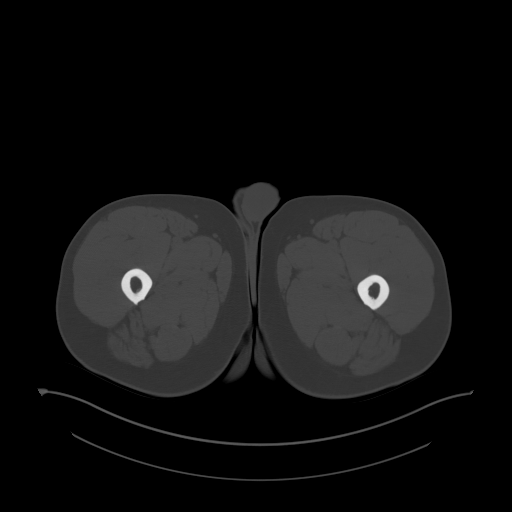
[im 14/110  soft-tissue]
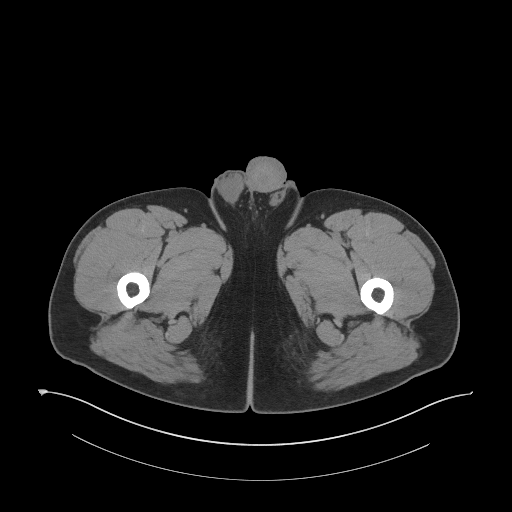
[im 22/110  soft-tissue]
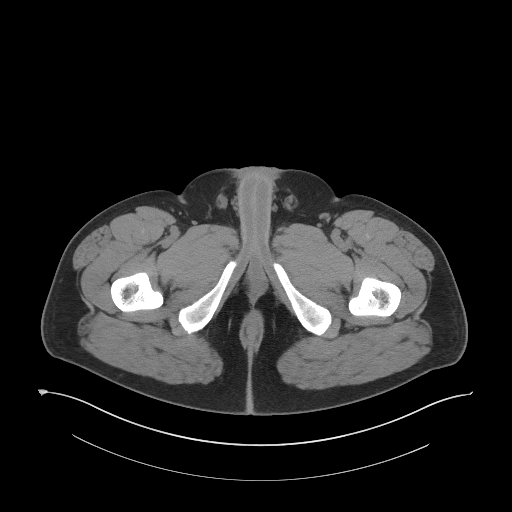
[im 31/110  soft-tissue]
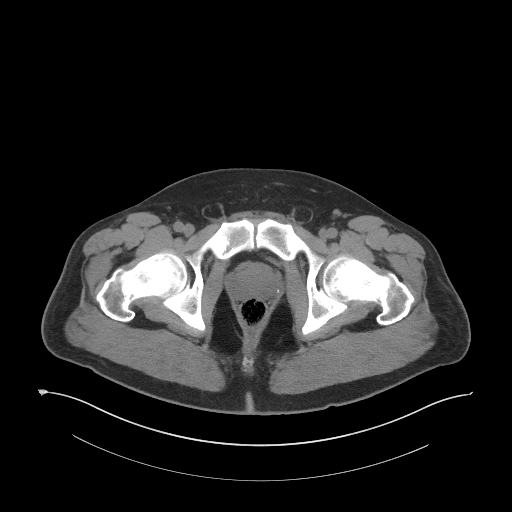
[im 40/110  soft-tissue]
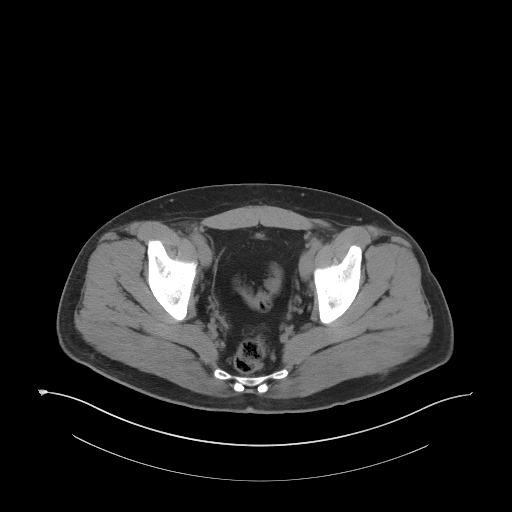
[im 48/110  soft-tissue]
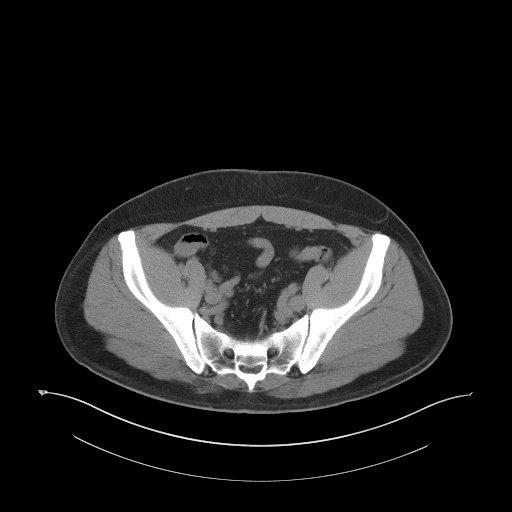
[im 57/110  soft-tissue]
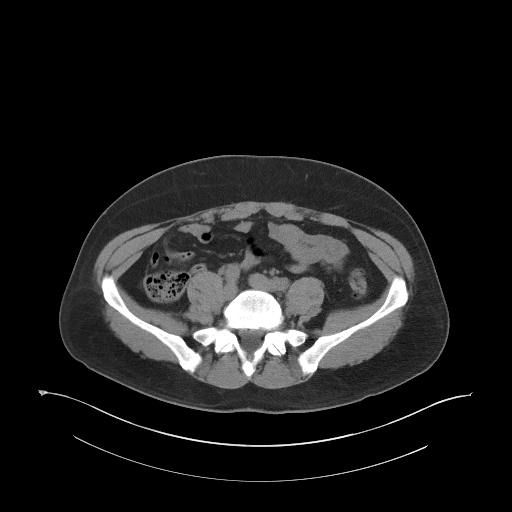
[im 62/110  soft-tissue]
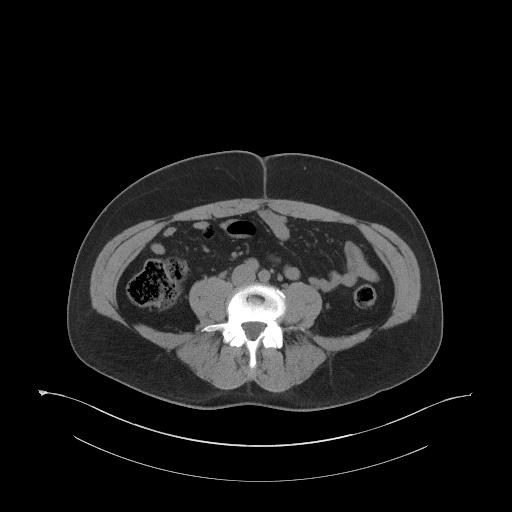
[im 70/110  soft-tissue]
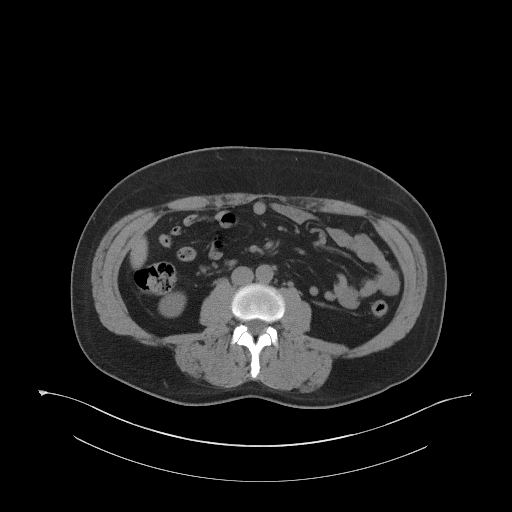
[im 70/110  bone]
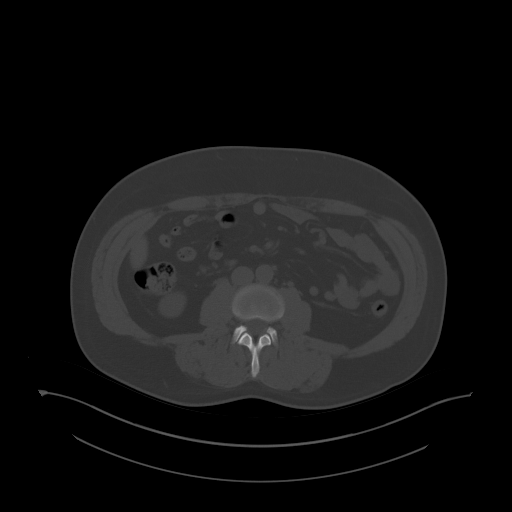
[im 79/110  soft-tissue]
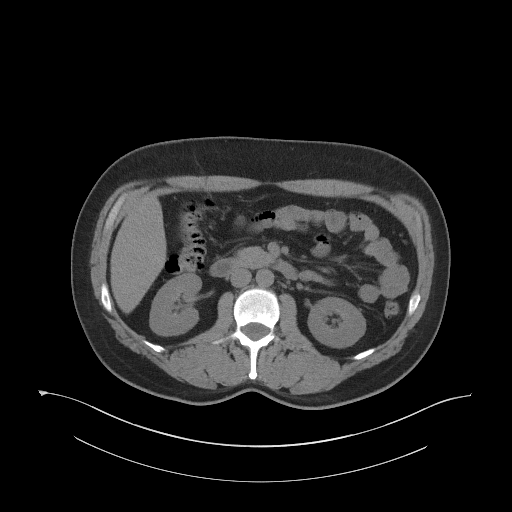
[im 88/110  soft-tissue]
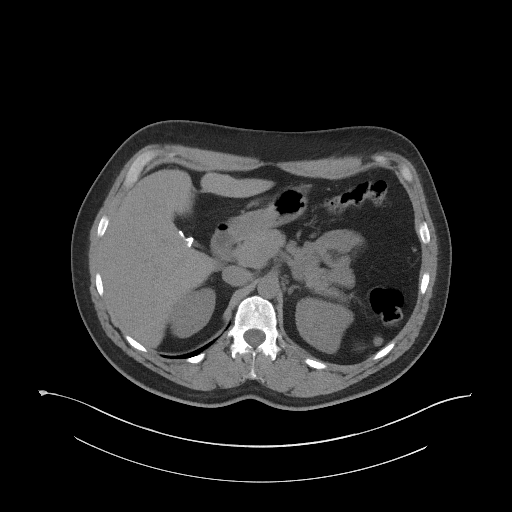
[im 96/110  soft-tissue]
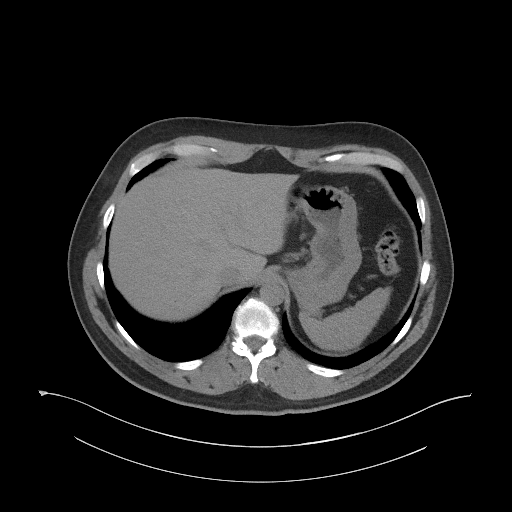
[im 105/110  soft-tissue]
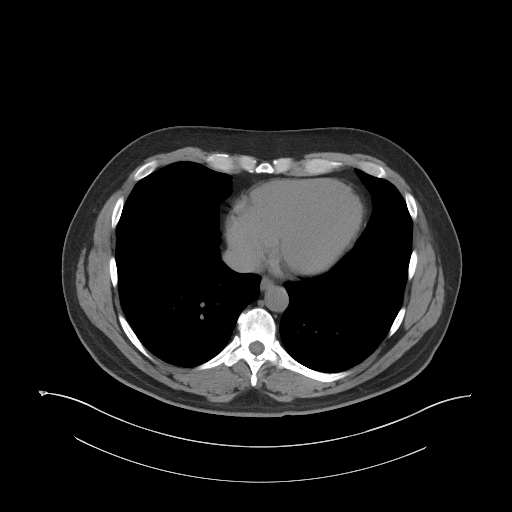

[Series 5: coronal st · coronal · 0.86mm/px · 3 of 97 slices shown]
[im 33/97  soft-tissue]
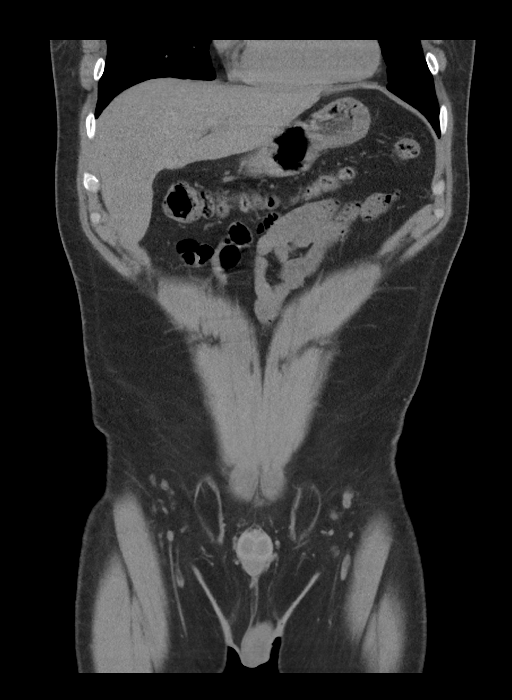
[im 43/97  soft-tissue]
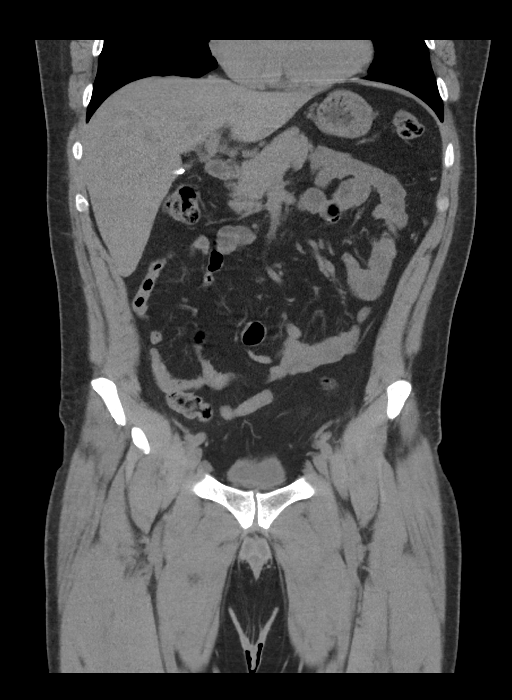
[im 54/97  soft-tissue]
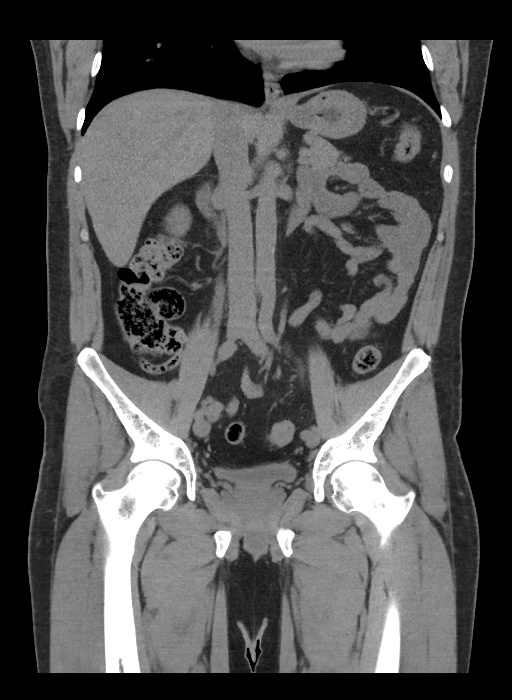

[16 of 46 positions shown; findings below may reference images not displayed]

FINDINGS: Lower chest: Lung bases are clear.

Hepatobiliary: Unenhanced liver is unremarkable.

Status post cholecystectomy. No intrahepatic or extrahepatic ductal
dilatation.

Pancreas: Within normal limits.

Spleen: Within normal limits.

Adrenals/Urinary Tract: Adrenal glands are within normal limits.

Punctate nonobstructing interpolar right renal calculus (coronal
image 66). 5 mm proximal left ureteral calculus at the L5 level
(coronal image 52), previously located within the right upper pole.
Left kidney is within normal limits. No hydronephrosis.

Bladder is underdistended but unremarkable.

Stomach/Bowel: Stomach is within normal limits.

No evidence of bowel obstruction.

Normal appendix (series 2/image 87).

Vascular/Lymphatic: No evidence of abdominal aortic aneurysm.

No suspicious abdominopelvic lymphadenopathy. 11 mm short axis right
deep inguinal node (series 2/image 76), previously 12 mm, likely
reactive.

Reproductive: Prostate is unremarkable.

Other: No abdominopelvic ascites.

Mild fat within the bilateral inguinal canals.

Musculoskeletal: Mild degenerative changes at L5-S1.
IMPRESSION: 5 mm proximal left ureteral calculus at the L5 level. No
hydronephrosis.

## 2021-01-26 IMAGING — CT CT ABD-PELV W/ CM
2 of 5 series · 16 of 46 positions shown, 18 images · IV contrast (APPLIED)
Comparison: CT dated 10/18/2019

CLINICAL DATA: Abdominal pain. Low back pain x1 week.

EXAM:
CT ABDOMEN AND PELVIS WITH CONTRAST
TECHNIQUE: Multidetector CT imaging of the abdomen and pelvis was performed
using the standard protocol following bolus administration of
intravenous contrast.
CONTRAST:  100mL OMNIPAQUE IOHEXOL 300 MG/ML  SOLN

[Series 3: abdomen 5.0 · axial · 0.98mm/px · z∈[-509,-64]mm · 13 of 103 slices shown, 15 images]
[im 7/103  soft-tissue]
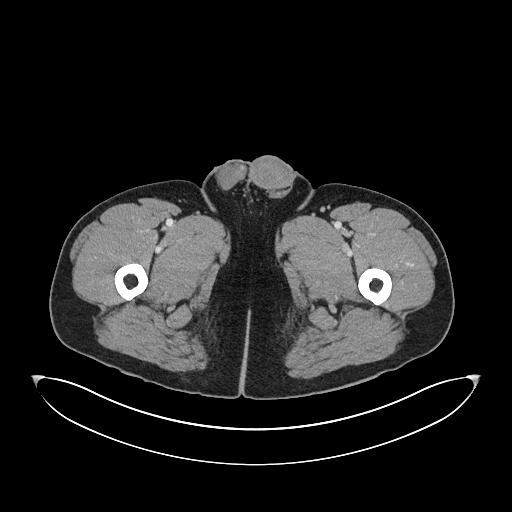
[im 7/103  bone]
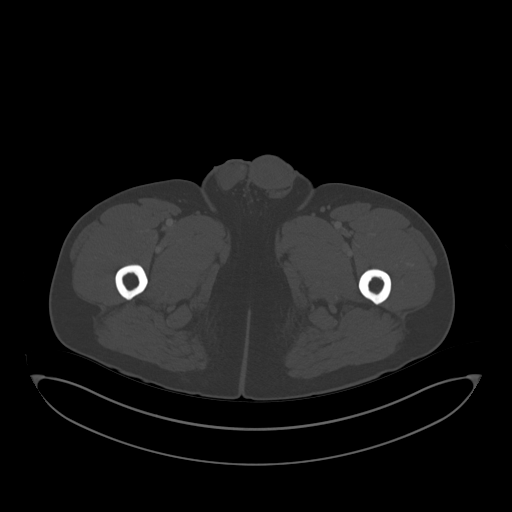
[im 14/103  soft-tissue]
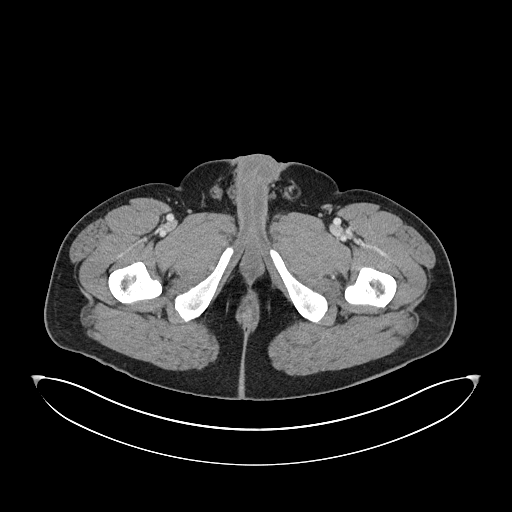
[im 21/103  soft-tissue]
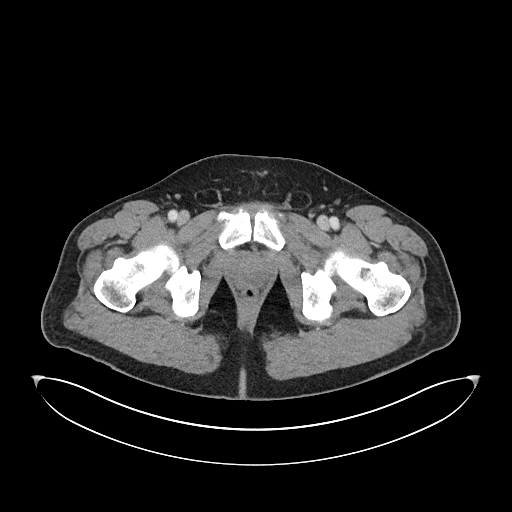
[im 28/103  soft-tissue]
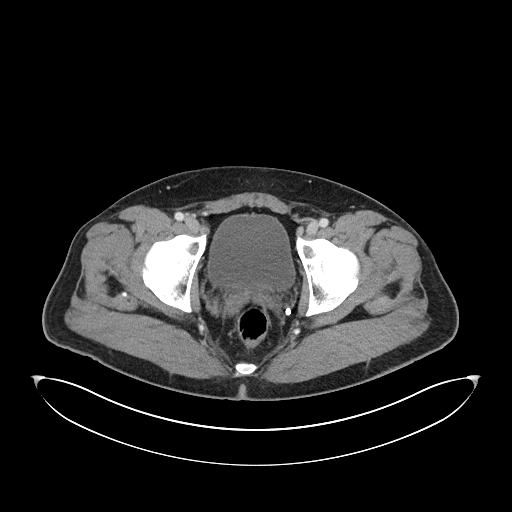
[im 35/103  soft-tissue]
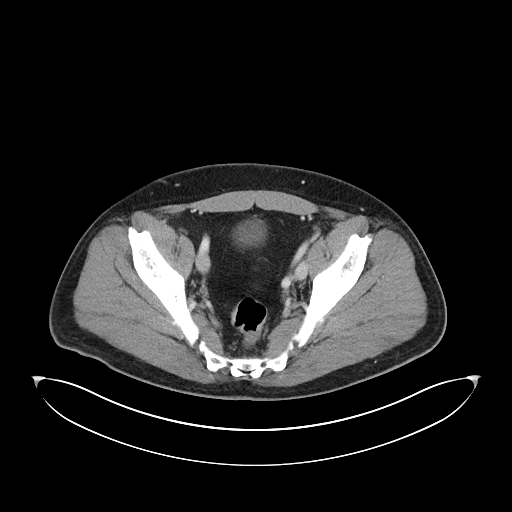
[im 41/103  soft-tissue]
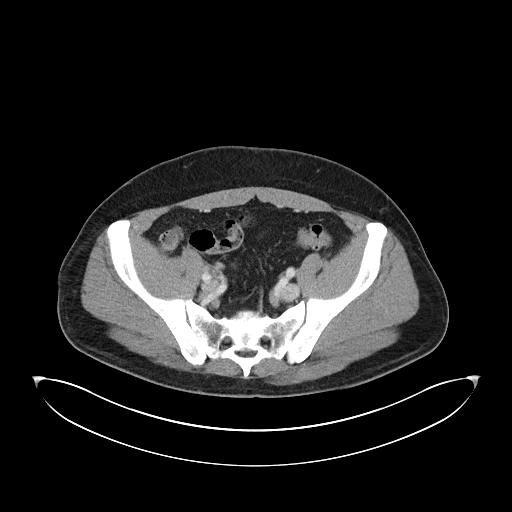
[im 55/103  soft-tissue]
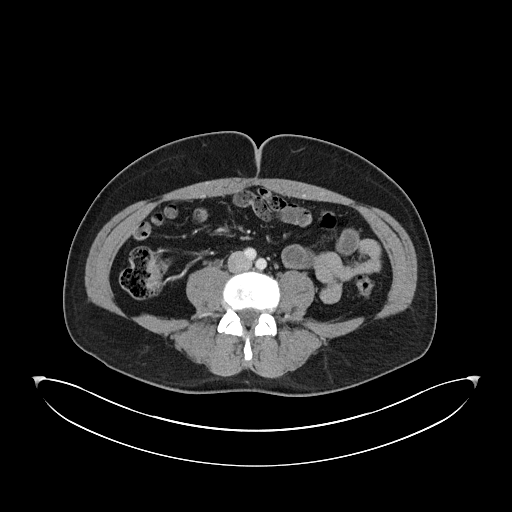
[im 62/103  soft-tissue]
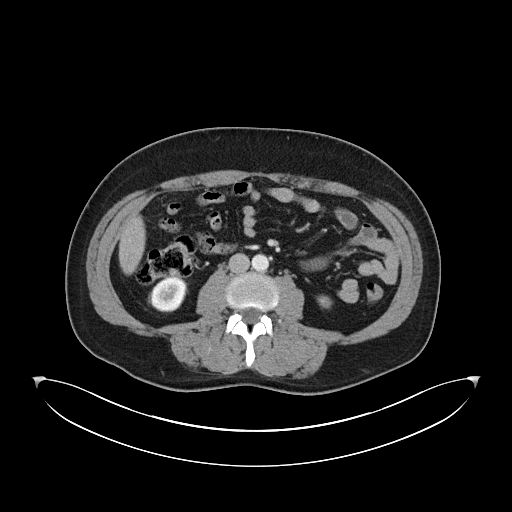
[im 69/103  soft-tissue]
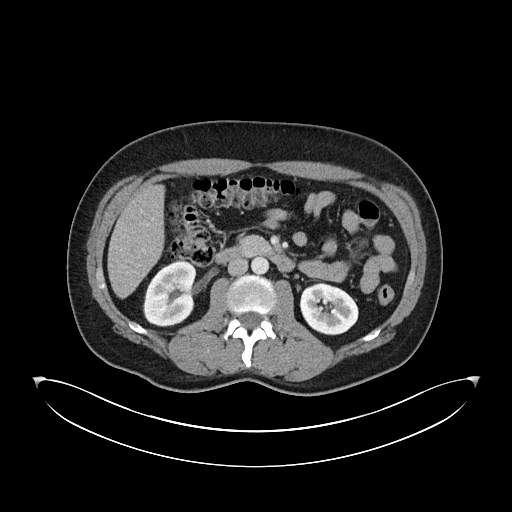
[im 69/103  bone]
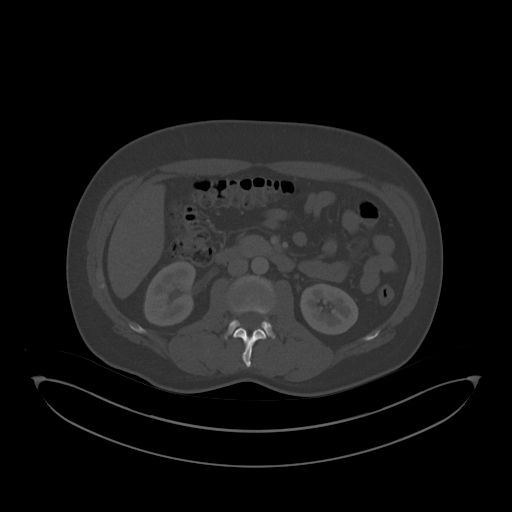
[im 75/103  soft-tissue]
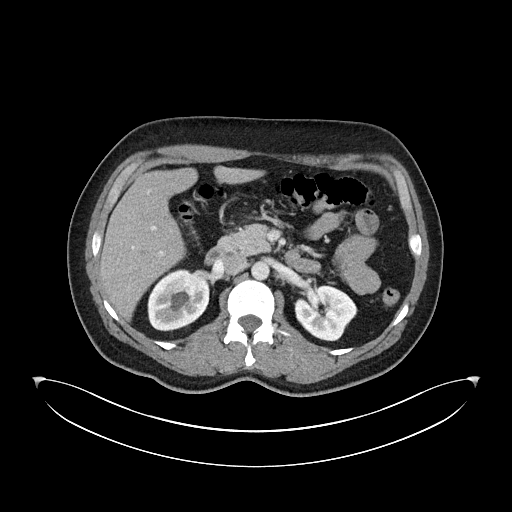
[im 82/103  soft-tissue]
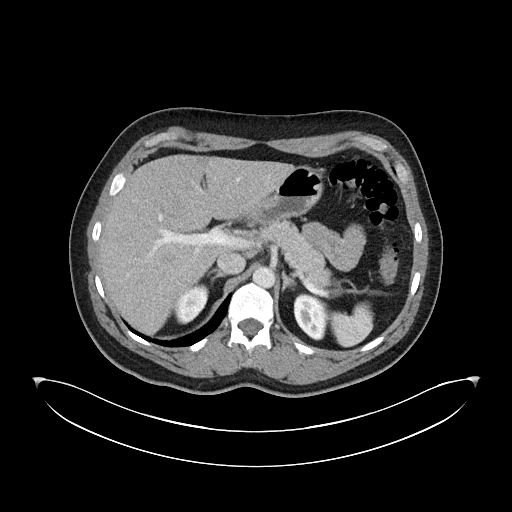
[im 89/103  soft-tissue]
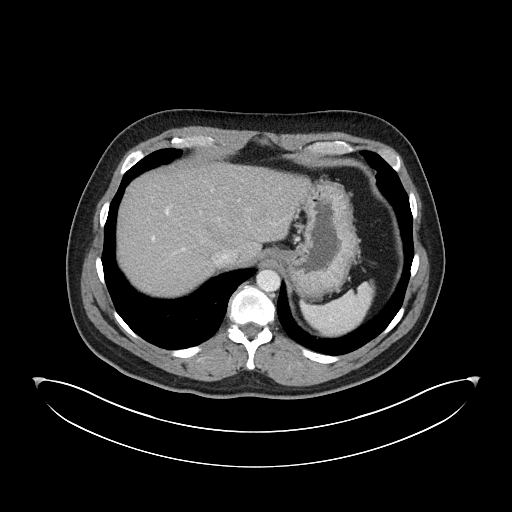
[im 96/103  soft-tissue]
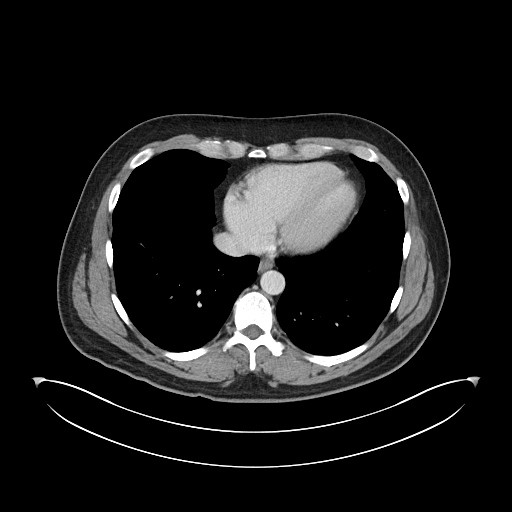

[Series 6: abdomen 3.0 mpr cor · coronal · 0.81mm/px · 3 of 98 slices shown]
[im 33/98  soft-tissue]
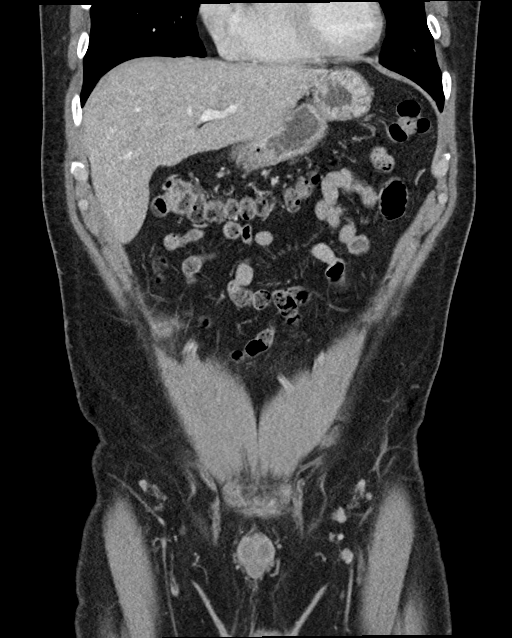
[im 44/98  soft-tissue]
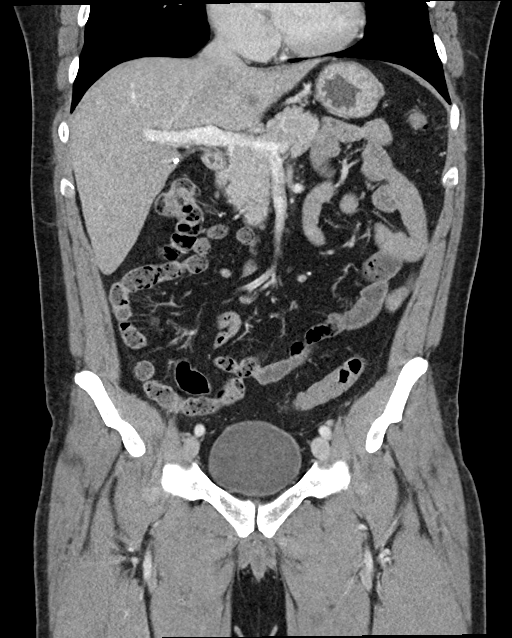
[im 54/98  soft-tissue]
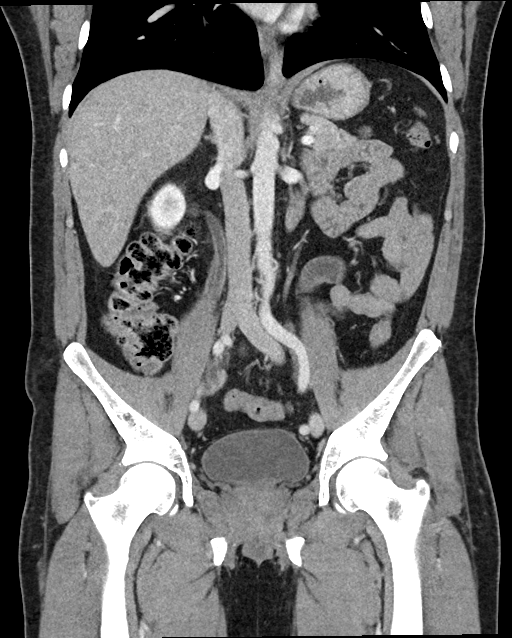

[16 of 46 positions shown; findings below may reference images not displayed]

FINDINGS: Lower chest: The lung bases are clear. The heart size is normal.

Hepatobiliary: The liver is normal. Status post
cholecystectomy.There is no biliary ductal dilation.

Pancreas: Normal contours without ductal dilatation. No
peripancreatic fluid collection.

Spleen: Unremarkable.

Adrenals/Urinary Tract:

--Adrenal glands: Unremarkable.

--Right kidney/ureter: There is mild right-sided
hydroureteronephrosis. This is secondary to a 6 mm stone in the
distal right ureter (axial series 3, image 65).

--Left kidney/ureter: No hydronephrosis or radiopaque kidney stones.

--Urinary bladder: Unremarkable.

Stomach/Bowel:

--Stomach/Duodenum: No hiatal hernia or other gastric abnormality.
Normal duodenal course and caliber.

--Small bowel: Unremarkable.

--Colon: Unremarkable.

--Appendix: Normal.

Vascular/Lymphatic: Normal course and caliber of the major abdominal
vessels.

--No retroperitoneal lymphadenopathy.

--No mesenteric lymphadenopathy.

--No pelvic or inguinal lymphadenopathy.

Reproductive: Unremarkable

Other: No ascites or free air. There are bilateral fat containing
inguinal hernias.

Musculoskeletal. No acute displaced fractures.
IMPRESSION: Mild right-sided hydroureteronephrosis secondary to a 6 mm distal
right ureteral stone.

## 2022-05-24 ENCOUNTER — Encounter: Payer: Self-pay | Admitting: Emergency Medicine

## 2022-05-24 ENCOUNTER — Ambulatory Visit
Admission: EM | Admit: 2022-05-24 | Discharge: 2022-05-24 | Disposition: A | Discharge status: Home / Self Care | Payer: Self-pay | Attending: Family Medicine | Admitting: Family Medicine

## 2022-05-24 ENCOUNTER — Ambulatory Visit (INDEPENDENT_AMBULATORY_CARE_PROVIDER_SITE_OTHER): Payer: Self-pay

## 2022-05-24 ENCOUNTER — Other Ambulatory Visit: Payer: Self-pay

## 2022-05-24 DIAGNOSIS — R109 Unspecified abdominal pain: Secondary | ICD-10-CM

## 2022-05-24 DIAGNOSIS — R1084 Generalized abdominal pain: Secondary | ICD-10-CM

## 2022-05-24 DIAGNOSIS — Z87442 Personal history of urinary calculi: Secondary | ICD-10-CM | POA: Insufficient documentation

## 2022-05-24 DIAGNOSIS — N200 Calculus of kidney: Secondary | ICD-10-CM

## 2022-05-24 LAB — POCT URINALYSIS DIP (MANUAL ENTRY)
Bilirubin, UA: NEGATIVE
Glucose, UA: NEGATIVE mg/dL
Ketones, POC UA: NEGATIVE mg/dL
Leukocytes, UA: NEGATIVE
Nitrite, UA: NEGATIVE
Protein Ur, POC: NEGATIVE mg/dL
Spec Grav, UA: 1.025 (ref 1.010–1.025)
Urobilinogen, UA: 0.2 E.U./dL
pH, UA: 5.5 (ref 5.0–8.0)

## 2022-05-24 MED ORDER — OXYCODONE-ACETAMINOPHEN 5-325 MG PO TABS: 1.0000 | ORAL_TABLET | Freq: Four times a day (QID) | ORAL | 0 refills | Status: AC | PRN

## 2022-05-24 MED ORDER — CIPROFLOXACIN HCL 500 MG PO TABS: 500.0000 mg | ORAL_TABLET | Freq: Two times a day (BID) | ORAL | 0 refills | Status: AC

## 2022-05-24 NOTE — ED Triage Notes (Signed)
Rt flank pain x 3 weeks intermittent. HX kidney stones

## 2022-05-24 NOTE — ED Provider Notes (Signed)
[name]    CSN: [id number] Arrival date & time: 05/24/22  1548      History   Chief Complaint Chief Complaint  Patient presents with   Flank Pain    HPI Willie Wise is a 51 y.o. Wise.   HPI Willie 51 year old Wise presents with right flank pain for 3 weeks intermittently.  Patient reports history of nephrolithiasis including kidney stone surgery.  Past Medical History:  Diagnosis Date   Cholelithiases    Kidney stone     There are no problems to display for this patient.   Past Surgical History:  Procedure Laterality Date   CHOLECYSTECTOMY     pt is not sure if his gall bladder was removed- thinks so   KIDNEY STONE SURGERY     PERCUTANEOUS PINNING PHALANX FRACTURE OF HAND Right        Home Medications    Prior to Admission medications   Medication Sig Start Date End Date Taking? Authorizing Provider  ciprofloxacin (CIPRO) 500 MG tablet Take 1 tablet (500 mg total) by mouth 2 (two) times daily for 5 days. 05/24/22 05/29/22 Yes Trevor Iha, FNP  oxyCODONE-acetaminophen (PERCOCET/ROXICET) 5-325 MG tablet Take 1 tablet by mouth every 6 (six) hours as needed for severe pain. 05/24/22  Yes Trevor Iha, FNP  docusate sodium (COLACE) 100 MG capsule Take 2 tablets daily until you have a good bowel movement.  Then 1 tablet daily until stools are soft. 11/30/19   Roxy Horseman, PA-C  ibuprofen (ADVIL) 800 MG tablet Take 1 tablet (800 mg total) by mouth 3 (three) times daily. as needed 10/18/19   Lattie Haw, MD    Family History Family History  Problem Relation Age of Onset   Healthy Mother     Social History Social History   Tobacco Use   Smoking status: Never   Smokeless tobacco: Never  Vaping Use   Vaping Use: Never used  Substance Use Topics   Alcohol use: Yes    Comment: occ   Drug use: No     Allergies   Patient has no known allergies.   Review of Systems Review of Systems  Genitourinary:  Positive for flank  pain.  All other systems reviewed and are negative.    Physical Exam Triage Vital Signs ED Triage Vitals  Enc Vitals Group     BP 05/24/22 1600 (!) 143/92     Pulse Rate 05/24/22 1600 71     Resp 05/24/22 1600 18     Temp 05/24/22 1600 98.8 F (37.1 C)     Temp Source 05/24/22 1600 Oral     SpO2 05/24/22 1600 97 %     Weight 05/24/22 1601 230 lb (104.3 kg)     Height 05/24/22 1601 6' (1.829 m)     Head Circumference --      Peak Flow --      Pain Score 05/24/22 1601 3     Pain Loc --      Pain Edu? --      Excl. in GC? --    No data found.  Updated Vital Signs BP (!) 143/92 (BP Location: Left Arm)   Pulse 71   Temp 98.8 F (37.1 C) (Oral)   Resp 18   Ht 6' (1.829 m)   Wt 230 lb (104.3 kg)   SpO2 97%   BMI 31.19 kg/m       Physical Exam Vitals and nursing note reviewed.  Constitutional:  General: He is not in acute distress.    Appearance: Normal appearance. He is normal weight. He is not ill-appearing.  HENT:     Head: Normocephalic and atraumatic.     Mouth/Throat:     Mouth: Mucous membranes are moist.     Pharynx: Oropharynx is clear.  Eyes:     Extraocular Movements: Extraocular movements intact.     Conjunctiva/sclera: Conjunctivae normal.     Pupils: Pupils are equal, round, and reactive to light.  Cardiovascular:     Rate and Rhythm: Normal rate and regular rhythm.     Pulses: Normal pulses.     Heart sounds: Normal heart sounds.  Pulmonary:     Effort: Pulmonary effort is normal.     Breath sounds: Normal breath sounds. No wheezing, rhonchi or rales.  Abdominal:     Tenderness: There is right CVA tenderness.  Musculoskeletal:        General: Normal range of motion.     Cervical back: Normal range of motion and neck supple.  Skin:    General: Skin is warm and dry.  Neurological:     General: No focal deficit present.     Mental Status: He is alert and oriented to person, place, and time. Mental status is at baseline.      UC  Treatments / Results  Labs (all labs ordered are listed, but only abnormal results are displayed) Labs Reviewed  POCT URINALYSIS DIP (MANUAL ENTRY) - Abnormal; Notable for the following components:      Result Value   Blood, UA trace-intact (*)    All other components within normal limits  URINE CULTURE    EKG   Radiology CT Renal Stone Study  Result Date: 05/24/2022 CLINICAL DATA:  Right flank pain for 3 weeks EXAM: CT ABDOMEN AND PELVIS WITHOUT CONTRAST TECHNIQUE: Multidetector CT imaging of the abdomen and pelvis was performed following the standard protocol without IV contrast. RADIATION DOSE REDUCTION: This exam was performed according to the departmental dose-optimization program which includes automated exposure control, adjustment of the mA and/or kV according to patient size and/or use of iterative reconstruction technique. COMPARISON:  11/30/2019 FINDINGS: Lower chest: No acute pleural or parenchymal lung disease. Hepatobiliary: Cholecystectomy. Unremarkable unenhanced appearance of the liver. No biliary duct dilation. Pancreas: Unremarkable unenhanced appearance. Spleen: Unremarkable unenhanced appearance. Adrenals/Urinary Tract: There are multiple punctate less than 2 mm nonobstructing renal calculi. At least 3 calcifications are seen on the right and 2 on the left. No obstructive uropathy within either kidney. No evidence of ureteral or bladder calculi. The bladder is decompressed, limiting its evaluation. The adrenals are unremarkable. Stomach/Bowel: No bowel obstruction or ileus. Normal appendix right lower quadrant. Minimal diverticulosis of the sigmoid colon without diverticulitis. No bowel wall thickening or inflammatory change. Vascular/Lymphatic: No significant vascular findings are present. No enlarged abdominal or pelvic lymph nodes. Reproductive: Prostate is unremarkable. Other: No free fluid or free intraperitoneal gas. No abdominal wall hernia. Musculoskeletal: No acute or  destructive bony lesions. Reconstructed images demonstrate no additional findings. IMPRESSION: 1. Punctate bilateral less than 2 mm nonobstructing renal calculi. No obstructive uropathy within either kidney. 2. Minimal sigmoid diverticulosis without diverticulitis. Electronically Signed   By: Sharlet Salina M.D.   On: 05/24/2022 16:32    Procedures Procedures (including critical care time)  Medications Ordered in UC Medications - No data to display  Initial Impression / Assessment and Plan / UC Course  I have reviewed the triage vital signs and the nursing notes.  Pertinent labs & imaging results that were available during my care of the patient were reviewed by me and considered in my medical decision making (see chart for details).     MDM: 1. Right Flank pain-CT of renal pelvis revealed above, Rx'd Cipro, Percocet. Advised/informed patient of CT renal pelvis results with hard copy provided to patient.  Advised patient that he has a total of 5 nonobstructive 2 mm kidney stones 3 on right, 2 on the left.  Advised patient to take medication as directed with food to completion.  Encouraged patient to increase daily water intake to 64 ounces per day for the next 7 days.  Advised patient we will follow-up with urine culture results once received.  Advised patient may use Norco for breakthrough right flank pain as needed.  Advised if symptoms worsen and/or unresolved please follow-up with PCP, urology, or here for further evaluation.  Patient discharged home, hemodynamically stable. Final Clinical Impressions(s) / UC Diagnoses   Final diagnoses:  Flank pain  Right flank pain  Nephrolithiasis     Discharge Instructions      Advised/informed patient of CT renal pelvis results with hard copy provided to patient.  Advised patient that he has a total of 5 nonobstructive 2 mm kidney stones 3 on right, 2 on the left.  Advised patient to take medication as directed with food to completion.  Encourage  patient to increase daily water intake to 64 ounces per day for the next 7 days.  Advised patient we will follow-up with urine culture results once received.  Advised patient may use Norco for breakthrough right flank pain as needed.  Advised if symptoms worsen and/or unresolved please follow-up with PCP, urology, or here for further evaluation.     ED Prescriptions     Medication Sig Dispense Auth. Provider   ciprofloxacin (CIPRO) 500 MG tablet Take 1 tablet (500 mg total) by mouth 2 (two) times daily for 5 days. 10 tablet Trevor Iha, FNP   oxyCODONE-acetaminophen (PERCOCET/ROXICET) 5-325 MG tablet Take 1 tablet by mouth every 6 (six) hours as needed for severe pain. 15 tablet Trevor Iha, FNP      I have reviewed the PDMP during this encounter.   Trevor Iha, FNP 05/24/22 1726

## 2022-05-24 NOTE — Discharge Instructions (Addendum)
Advised/informed patient of CT renal pelvis results with hard copy provided to patient.  Advised patient that he has a total of 5 nonobstructive 2 mm kidney stones 3 on right, 2 on the left.  Advised patient to take medication as directed with food to completion.  Encourage patient to increase daily water intake to 64 ounces per day for the next 7 days.  Advised patient we will follow-up with urine culture results once received.  Advised patient may use Norco for breakthrough right flank pain as needed.  Advised if symptoms worsen and/or unresolved please follow-up with PCP, urology, or here for further evaluation.

## 2022-05-26 LAB — URINE CULTURE: Culture: NO GROWTH
